# Patient Record
Sex: Female | Born: 1940 | Race: White | Hispanic: No
Health system: Southern US, Community
[De-identification: ages and names within clinical notes are randomized; demographics above are authoritative.]

## PROBLEM LIST (undated history)

## (undated) DIAGNOSIS — E119 Type 2 diabetes mellitus without complications: Secondary | ICD-10-CM

## (undated) DIAGNOSIS — F0280 Dementia in other diseases classified elsewhere without behavioral disturbance: Secondary | ICD-10-CM

## (undated) DIAGNOSIS — D649 Anemia, unspecified: Secondary | ICD-10-CM

## (undated) DIAGNOSIS — G629 Polyneuropathy, unspecified: Secondary | ICD-10-CM

---

## 2019-11-12 DIAGNOSIS — G309 Alzheimer's disease, unspecified: Secondary | ICD-10-CM | POA: Diagnosis present

## 2019-11-12 DIAGNOSIS — N390 Urinary tract infection, site not specified: Secondary | ICD-10-CM | POA: Diagnosis present

## 2019-11-12 DIAGNOSIS — Z20822 Contact with and (suspected) exposure to covid-19: Secondary | ICD-10-CM | POA: Diagnosis present

## 2019-11-12 DIAGNOSIS — D649 Anemia, unspecified: Secondary | ICD-10-CM | POA: Diagnosis present

## 2019-11-12 DIAGNOSIS — E871 Hypo-osmolality and hyponatremia: Secondary | ICD-10-CM | POA: Diagnosis present

## 2019-11-12 DIAGNOSIS — Z6821 Body mass index (BMI) 21.0-21.9, adult: Secondary | ICD-10-CM

## 2019-11-12 DIAGNOSIS — E43 Unspecified severe protein-calorie malnutrition: Secondary | ICD-10-CM | POA: Diagnosis present

## 2019-11-12 DIAGNOSIS — R6521 Severe sepsis with septic shock: Secondary | ICD-10-CM | POA: Diagnosis present

## 2019-11-12 DIAGNOSIS — Z515 Encounter for palliative care: Secondary | ICD-10-CM | POA: Diagnosis not present

## 2019-11-12 DIAGNOSIS — R001 Bradycardia, unspecified: Secondary | ICD-10-CM | POA: Diagnosis present

## 2019-11-12 DIAGNOSIS — E1110 Type 2 diabetes mellitus with ketoacidosis without coma: Secondary | ICD-10-CM | POA: Diagnosis present

## 2019-11-12 DIAGNOSIS — K3189 Other diseases of stomach and duodenum: Secondary | ICD-10-CM | POA: Diagnosis present

## 2019-11-12 DIAGNOSIS — N179 Acute kidney failure, unspecified: Secondary | ICD-10-CM | POA: Diagnosis present

## 2019-11-12 DIAGNOSIS — I4891 Unspecified atrial fibrillation: Secondary | ICD-10-CM | POA: Diagnosis present

## 2019-11-12 DIAGNOSIS — Z66 Do not resuscitate: Secondary | ICD-10-CM | POA: Diagnosis not present

## 2019-11-12 DIAGNOSIS — E039 Hypothyroidism, unspecified: Secondary | ICD-10-CM | POA: Diagnosis present

## 2019-11-12 DIAGNOSIS — E1140 Type 2 diabetes mellitus with diabetic neuropathy, unspecified: Secondary | ICD-10-CM | POA: Diagnosis present

## 2019-11-12 DIAGNOSIS — F0280 Dementia in other diseases classified elsewhere without behavioral disturbance: Secondary | ICD-10-CM | POA: Diagnosis present

## 2019-11-12 DIAGNOSIS — A419 Sepsis, unspecified organism: Principal | ICD-10-CM | POA: Diagnosis present

## 2019-11-12 DIAGNOSIS — E872 Acidosis: Secondary | ICD-10-CM | POA: Diagnosis present

## 2019-11-12 DIAGNOSIS — K8020 Calculus of gallbladder without cholecystitis without obstruction: Secondary | ICD-10-CM | POA: Diagnosis present

## 2019-11-12 DIAGNOSIS — I469 Cardiac arrest, cause unspecified: Secondary | ICD-10-CM | POA: Diagnosis present

## 2019-11-13 ENCOUNTER — Inpatient Hospital Stay: Payer: Medicare Other

## 2019-11-13 ENCOUNTER — Emergency Department: Payer: Medicare Other

## 2019-11-13 ENCOUNTER — Inpatient Hospital Stay
Admission: EM | Admit: 2019-11-13 | Discharge: 2019-11-21 | DRG: 871 | Disposition: E | Payer: Medicare Other | Source: Skilled Nursing Facility | Attending: Internal Medicine | Admitting: Internal Medicine

## 2019-11-13 ENCOUNTER — Inpatient Hospital Stay: Admit: 2019-11-13 | Payer: Medicare Other

## 2019-11-13 ENCOUNTER — Other Ambulatory Visit: Payer: Self-pay

## 2019-11-13 ENCOUNTER — Encounter: Payer: Self-pay | Admitting: Emergency Medicine

## 2019-11-13 DIAGNOSIS — R569 Unspecified convulsions: Secondary | ICD-10-CM

## 2019-11-13 DIAGNOSIS — E43 Unspecified severe protein-calorie malnutrition: Secondary | ICD-10-CM | POA: Insufficient documentation

## 2019-11-13 DIAGNOSIS — K8020 Calculus of gallbladder without cholecystitis without obstruction: Secondary | ICD-10-CM | POA: Diagnosis present

## 2019-11-13 DIAGNOSIS — R001 Bradycardia, unspecified: Secondary | ICD-10-CM | POA: Diagnosis present

## 2019-11-13 DIAGNOSIS — Z7189 Other specified counseling: Secondary | ICD-10-CM

## 2019-11-13 DIAGNOSIS — I4891 Unspecified atrial fibrillation: Secondary | ICD-10-CM | POA: Diagnosis present

## 2019-11-13 DIAGNOSIS — F0280 Dementia in other diseases classified elsewhere without behavioral disturbance: Secondary | ICD-10-CM | POA: Diagnosis present

## 2019-11-13 DIAGNOSIS — E1110 Type 2 diabetes mellitus with ketoacidosis without coma: Secondary | ICD-10-CM

## 2019-11-13 DIAGNOSIS — Z6821 Body mass index (BMI) 21.0-21.9, adult: Secondary | ICD-10-CM | POA: Diagnosis not present

## 2019-11-13 DIAGNOSIS — F0390 Unspecified dementia without behavioral disturbance: Secondary | ICD-10-CM

## 2019-11-13 DIAGNOSIS — I469 Cardiac arrest, cause unspecified: Secondary | ICD-10-CM

## 2019-11-13 DIAGNOSIS — E039 Hypothyroidism, unspecified: Secondary | ICD-10-CM | POA: Diagnosis present

## 2019-11-13 DIAGNOSIS — Z515 Encounter for palliative care: Secondary | ICD-10-CM

## 2019-11-13 DIAGNOSIS — D649 Anemia, unspecified: Secondary | ICD-10-CM | POA: Diagnosis present

## 2019-11-13 DIAGNOSIS — G309 Alzheimer's disease, unspecified: Secondary | ICD-10-CM | POA: Diagnosis present

## 2019-11-13 DIAGNOSIS — R6520 Severe sepsis without septic shock: Secondary | ICD-10-CM | POA: Diagnosis not present

## 2019-11-13 DIAGNOSIS — K3189 Other diseases of stomach and duodenum: Secondary | ICD-10-CM | POA: Diagnosis present

## 2019-11-13 DIAGNOSIS — N179 Acute kidney failure, unspecified: Secondary | ICD-10-CM | POA: Diagnosis present

## 2019-11-13 DIAGNOSIS — E1140 Type 2 diabetes mellitus with diabetic neuropathy, unspecified: Secondary | ICD-10-CM | POA: Diagnosis present

## 2019-11-13 DIAGNOSIS — A419 Sepsis, unspecified organism: Secondary | ICD-10-CM | POA: Diagnosis present

## 2019-11-13 DIAGNOSIS — R6521 Severe sepsis with septic shock: Secondary | ICD-10-CM

## 2019-11-13 DIAGNOSIS — N390 Urinary tract infection, site not specified: Secondary | ICD-10-CM | POA: Diagnosis present

## 2019-11-13 DIAGNOSIS — E872 Acidosis: Secondary | ICD-10-CM | POA: Diagnosis present

## 2019-11-13 DIAGNOSIS — E871 Hypo-osmolality and hyponatremia: Secondary | ICD-10-CM | POA: Diagnosis present

## 2019-11-13 DIAGNOSIS — Z66 Do not resuscitate: Secondary | ICD-10-CM | POA: Diagnosis not present

## 2019-11-13 DIAGNOSIS — Z20822 Contact with and (suspected) exposure to covid-19: Secondary | ICD-10-CM | POA: Diagnosis present

## 2019-11-13 DIAGNOSIS — Z452 Encounter for adjustment and management of vascular access device: Secondary | ICD-10-CM

## 2019-11-13 DIAGNOSIS — J9601 Acute respiratory failure with hypoxia: Secondary | ICD-10-CM | POA: Diagnosis not present

## 2019-11-13 HISTORY — DX: Type 2 diabetes mellitus without complications: E11.9

## 2019-11-13 HISTORY — DX: Polyneuropathy, unspecified: G62.9

## 2019-11-13 HISTORY — DX: Dementia in other diseases classified elsewhere, unspecified severity, without behavioral disturbance, psychotic disturbance, mood disturbance, and anxiety: F02.80

## 2019-11-13 HISTORY — DX: Anemia, unspecified: D64.9

## 2019-11-13 LAB — COMPREHENSIVE METABOLIC PANEL
ALT: 25 U/L (ref 0–44)
AST: 20 U/L (ref 15–41)
Albumin: 4.1 g/dL (ref 3.5–5.0)
Alkaline Phosphatase: 98 U/L (ref 38–126)
BUN: 30 mg/dL — ABNORMAL HIGH (ref 8–23)
CO2: <7 mmol/L — ABNORMAL LOW (ref 22–32)
Calcium: 9.4 mg/dL (ref 8.9–10.3)
Chloride: 85 mmol/L — ABNORMAL LOW (ref 98–111)
Creatinine, Ser: 1.91 mg/dL — ABNORMAL HIGH (ref 0.44–1.00)
GFR calc Af Amer: 28 mL/min — ABNORMAL LOW (ref >60)
GFR calc non Af Amer: 24 mL/min — ABNORMAL LOW (ref >60)
Glucose, Bld: 855 mg/dL (ref 70–99)
Potassium: 4.4 mmol/L (ref 3.5–5.1)
Sodium: 131 mmol/L — ABNORMAL LOW (ref 135–145)
Total Bilirubin: 3.3 mg/dL — ABNORMAL HIGH (ref 0.3–1.2)
Total Protein: 7.4 g/dL (ref 6.5–8.1)

## 2019-11-13 LAB — BASIC METABOLIC PANEL
Anion gap: 33 — ABNORMAL HIGH (ref 5–15)
Anion gap: 21 — ABNORMAL HIGH (ref 5–15)
BUN: 32 mg/dL — ABNORMAL HIGH (ref 8–23)
BUN: 32 mg/dL — ABNORMAL HIGH (ref 8–23)
BUN: 30 mg/dL — ABNORMAL HIGH (ref 8–23)
CO2: <7 mmol/L — ABNORMAL LOW (ref 22–32)
CO2: 17 mmol/L — ABNORMAL LOW (ref 22–32)
CO2: 29 mmol/L (ref 22–32)
Calcium: 9.3 mg/dL (ref 8.9–10.3)
Calcium: 8 mg/dL — ABNORMAL LOW (ref 8.9–10.3)
Calcium: 7.6 mg/dL — ABNORMAL LOW (ref 8.9–10.3)
Chloride: 86 mmol/L — ABNORMAL LOW (ref 98–111)
Chloride: 91 mmol/L — ABNORMAL LOW (ref 98–111)
Chloride: 94 mmol/L — ABNORMAL LOW (ref 98–111)
Creatinine, Ser: 2.18 mg/dL — ABNORMAL HIGH (ref 0.44–1.00)
Creatinine, Ser: 2.07 mg/dL — ABNORMAL HIGH (ref 0.44–1.00)
Creatinine, Ser: 1.6 mg/dL — ABNORMAL HIGH (ref 0.44–1.00)
GFR calc Af Amer: 24 mL/min — ABNORMAL LOW (ref >60)
GFR calc Af Amer: 26 mL/min — ABNORMAL LOW (ref >60)
GFR calc Af Amer: 35 mL/min — ABNORMAL LOW (ref >60)
GFR calc non Af Amer: 21 mL/min — ABNORMAL LOW (ref >60)
GFR calc non Af Amer: 22 mL/min — ABNORMAL LOW (ref >60)
GFR calc non Af Amer: 30 mL/min — ABNORMAL LOW (ref >60)
Glucose, Bld: 910 mg/dL (ref 70–99)
Glucose, Bld: 544 mg/dL (ref 70–99)
Glucose, Bld: 317 mg/dL — ABNORMAL HIGH (ref 70–99)
Potassium: 4.9 mmol/L (ref 3.5–5.1)
Potassium: 3.7 mmol/L (ref 3.5–5.1)
Potassium: 3.3 mmol/L — ABNORMAL LOW (ref 3.5–5.1)
Sodium: 133 mmol/L — ABNORMAL LOW (ref 135–145)
Sodium: 141 mmol/L (ref 135–145)
Sodium: 144 mmol/L (ref 135–145)

## 2019-11-13 LAB — CBC WITH DIFFERENTIAL/PLATELET
Abs Immature Granulocytes: 0.21 10*3/uL — ABNORMAL HIGH (ref 0.00–0.07)
Basophils Absolute: 0.1 10*3/uL (ref 0.0–0.1)
Basophils Relative: 0 %
Eosinophils Absolute: 0 10*3/uL (ref 0.0–0.5)
Eosinophils Relative: 0 %
HCT: 43.7 % (ref 36.0–46.0)
Hemoglobin: 13.5 g/dL (ref 12.0–15.0)
Immature Granulocytes: 1 %
Lymphocytes Relative: 5 %
Lymphs Abs: 0.9 10*3/uL (ref 0.7–4.0)
MCH: 31.1 pg (ref 26.0–34.0)
MCHC: 30.9 g/dL (ref 30.0–36.0)
MCV: 100.7 fL — ABNORMAL HIGH (ref 80.0–100.0)
Monocytes Absolute: 1.1 10*3/uL — ABNORMAL HIGH (ref 0.1–1.0)
Monocytes Relative: 6 %
Neutro Abs: 14.6 10*3/uL — ABNORMAL HIGH (ref 1.7–7.7)
Neutrophils Relative %: 88 %
Platelets: 425 10*3/uL — ABNORMAL HIGH (ref 150–400)
RBC: 4.34 MIL/uL (ref 3.87–5.11)
RDW: 14.1 % (ref 11.5–15.5)
WBC: 16.8 10*3/uL — ABNORMAL HIGH (ref 4.0–10.5)
nRBC: 0 % (ref 0.0–0.2)

## 2019-11-13 LAB — TROPONIN I (HIGH SENSITIVITY)
Troponin I (High Sensitivity): 11 ng/L (ref <18)
Troponin I (High Sensitivity): 250 ng/L (ref <18)
Troponin I (High Sensitivity): 335 ng/L (ref <18)

## 2019-11-13 LAB — BLOOD GAS, ARTERIAL
Acid-base deficit: 22.1 mmol/L — ABNORMAL HIGH (ref 0.0–2.0)
Bicarbonate: 6.9 mmol/L — ABNORMAL LOW (ref 20.0–28.0)
FIO2: 1
MECHVT: 400 mL
Mechanical Rate: 16
O2 Saturation: 99.8 %
PEEP: 5 cmH2O
Patient temperature: 37
pCO2 arterial: 25 mmHg — ABNORMAL LOW (ref 32.0–48.0)
pH, Arterial: 7.05 — CL (ref 7.350–7.450)
pO2, Arterial: 281 mmHg — ABNORMAL HIGH (ref 83.0–108.0)

## 2019-11-13 LAB — PROTIME-INR
INR: 1.1 (ref 0.8–1.2)
Prothrombin Time: 13.5 seconds (ref 11.4–15.2)

## 2019-11-13 LAB — GLUCOSE, CAPILLARY
Glucose-Capillary: >600 mg/dL (ref 70–99)
Glucose-Capillary: >600 mg/dL (ref 70–99)
Glucose-Capillary: 597 mg/dL (ref 70–99)
Glucose-Capillary: >600 mg/dL (ref 70–99)
Glucose-Capillary: >600 mg/dL (ref 70–99)
Glucose-Capillary: 499 mg/dL — ABNORMAL HIGH (ref 70–99)
Glucose-Capillary: 561 mg/dL (ref 70–99)
Glucose-Capillary: 439 mg/dL — ABNORMAL HIGH (ref 70–99)
Glucose-Capillary: 482 mg/dL — ABNORMAL HIGH (ref 70–99)
Glucose-Capillary: 402 mg/dL — ABNORMAL HIGH (ref 70–99)
Glucose-Capillary: 370 mg/dL — ABNORMAL HIGH (ref 70–99)
Glucose-Capillary: 363 mg/dL — ABNORMAL HIGH (ref 70–99)
Glucose-Capillary: 284 mg/dL — ABNORMAL HIGH (ref 70–99)
Glucose-Capillary: 247 mg/dL — ABNORMAL HIGH (ref 70–99)

## 2019-11-13 LAB — BLOOD GAS, VENOUS
Acid-base deficit: 28.5 mmol/L — ABNORMAL HIGH (ref 0.0–2.0)
Bicarbonate: 4.7 mmol/L — ABNORMAL LOW (ref 20.0–28.0)
O2 Saturation: 42.8 %
Patient temperature: 37
pCO2, Ven: 27 mmHg — ABNORMAL LOW (ref 44.0–60.0)
pH, Ven: <6.9 — CL (ref 7.250–7.430)
pO2, Ven: 46 mmHg — ABNORMAL HIGH (ref 32.0–45.0)

## 2019-11-13 LAB — BETA-HYDROXYBUTYRIC ACID
Beta-Hydroxybutyric Acid: >8 mmol/L — ABNORMAL HIGH (ref 0.05–0.27)
Beta-Hydroxybutyric Acid: 5.26 mmol/L — ABNORMAL HIGH (ref 0.05–0.27)

## 2019-11-13 LAB — MRSA PCR SCREENING: MRSA by PCR: NEGATIVE

## 2019-11-13 LAB — LACTIC ACID, PLASMA
Lactic Acid, Venous: 4.6 mmol/L (ref 0.5–1.9)
Lactic Acid, Venous: 3.2 mmol/L (ref 0.5–1.9)
Lactic Acid, Venous: 1.8 mmol/L (ref 0.5–1.9)
Lactic Acid, Venous: 2.1 mmol/L (ref 0.5–1.9)

## 2019-11-13 LAB — MAGNESIUM: Magnesium: 2.7 mg/dL — ABNORMAL HIGH (ref 1.7–2.4)

## 2019-11-13 LAB — LIPASE, BLOOD: Lipase: 18 U/L (ref 11–51)

## 2019-11-13 LAB — SARS CORONAVIRUS 2 BY RT PCR (HOSPITAL ORDER, PERFORMED IN ~~LOC~~ HOSPITAL LAB): SARS Coronavirus 2: NEGATIVE

## 2019-11-13 LAB — TSH: TSH: 5.028 u[IU]/mL — ABNORMAL HIGH (ref 0.350–4.500)

## 2019-11-13 LAB — PHOSPHORUS: Phosphorus: 9.4 mg/dL — ABNORMAL HIGH (ref 2.5–4.6)

## 2019-11-13 LAB — PROCALCITONIN: Procalcitonin: 1.11 ng/mL

## 2019-11-13 MED ORDER — DEXTROSE 50 % IV SOLN: 0.0000 mL | INTRAVENOUS | Status: DC | PRN

## 2019-11-13 MED ORDER — POLYETHYLENE GLYCOL 3350 17 G PO PACK
17.0000 g | PACK | Freq: Every day | ORAL | Status: DC
  Administered 2019-11-13: 17 g via ORAL
  Filled 2019-11-13: qty 1

## 2019-11-13 MED ORDER — HEPARIN SODIUM (PORCINE) 5000 UNIT/ML IJ SOLN
5000.0000 [IU] | Freq: Three times a day (TID) | INTRAMUSCULAR | Status: DC
  Administered 2019-11-13 (×2): 5000 [IU] via SUBCUTANEOUS
  Filled 2019-11-13 (×2): qty 1

## 2019-11-13 MED ORDER — FENTANYL 2500MCG IN NS 250ML (10MCG/ML) PREMIX INFUSION
INTRAVENOUS | Status: AC
  Administered 2019-11-13: 50 ug/h via INTRAVENOUS
  Filled 2019-11-13: qty 250

## 2019-11-13 MED ORDER — SODIUM BICARBONATE-DEXTROSE 150-5 MEQ/L-% IV SOLN
150.0000 meq | INTRAVENOUS | Status: DC
  Administered 2019-11-13: 150 meq via INTRAVENOUS
  Filled 2019-11-13 (×5): qty 1000

## 2019-11-13 MED ORDER — MIDAZOLAM HCL 2 MG/2ML IJ SOLN
1.0000 mg | INTRAMUSCULAR | Status: DC | PRN
  Administered 2019-11-13: 1 mg via INTRAVENOUS
  Filled 2019-11-13: qty 2

## 2019-11-13 MED ORDER — SODIUM BICARBONATE 8.4 % IV SOLN
50.0000 meq | Freq: Once | INTRAVENOUS | Status: AC
  Administered 2019-11-13: 50 meq via INTRAVENOUS

## 2019-11-13 MED ORDER — ATROPINE SULFATE 1 MG/10ML IJ SOSY
1.0000 mg | PREFILLED_SYRINGE | Freq: Once | INTRAMUSCULAR | Status: AC
  Administered 2019-11-13: 1 mg via INTRAVENOUS

## 2019-11-13 MED ORDER — SODIUM CHLORIDE 0.9 % IV SOLN: 250.0000 mL | INTRAVENOUS | Status: DC

## 2019-11-13 MED ORDER — CHLORHEXIDINE GLUCONATE CLOTH 2 % EX PADS: 6.0000 | MEDICATED_PAD | Freq: Every day | CUTANEOUS | Status: DC

## 2019-11-13 MED ORDER — EPINEPHRINE PF 1 MG/ML IJ SOLN
1.0000 mg | Freq: Once | INTRAMUSCULAR | Status: AC
  Administered 2019-11-13: 1 mg via INTRAVENOUS

## 2019-11-13 MED ORDER — POTASSIUM CHLORIDE 10 MEQ/100ML IV SOLN
10.0000 meq | INTRAVENOUS | Status: AC
  Administered 2019-11-13 (×2): 10 meq via INTRAVENOUS
  Filled 2019-11-13: qty 100

## 2019-11-13 MED ORDER — ETOMIDATE 2 MG/ML IV SOLN
10.0000 mg | Freq: Once | INTRAVENOUS | Status: AC
  Administered 2019-11-13: 10 mg via INTRAVENOUS

## 2019-11-13 MED ORDER — DOCUSATE SODIUM 50 MG/5ML PO LIQD
100.0000 mg | Freq: Two times a day (BID) | ORAL | Status: DC
  Administered 2019-11-13: 100 mg via ORAL
  Filled 2019-11-13 (×2): qty 10

## 2019-11-13 MED ORDER — SODIUM CHLORIDE 0.9 % IV SOLN
25.0000 ug/min | INTRAVENOUS | Status: DC
  Administered 2019-11-13: 25 ug/min via INTRAVENOUS
  Filled 2019-11-13: qty 1
  Filled 2019-11-13: qty 10

## 2019-11-13 MED ORDER — LEVOTHYROXINE SODIUM 50 MCG PO TABS: 25.0000 ug | ORAL_TABLET | Freq: Every day | ORAL | Status: DC

## 2019-11-13 MED ORDER — SODIUM BICARBONATE 8.4 % IV SOLN
100.0000 meq | Freq: Once | INTRAVENOUS | Status: AC
  Administered 2019-11-13: 100 meq via INTRAVENOUS

## 2019-11-13 MED ORDER — FAMOTIDINE IN NACL 20-0.9 MG/50ML-% IV SOLN
20.0000 mg | Freq: Two times a day (BID) | INTRAVENOUS | Status: DC
  Administered 2019-11-13: 20 mg via INTRAVENOUS
  Filled 2019-11-13: qty 50

## 2019-11-13 MED ORDER — LACTATED RINGERS IV SOLN: INTRAVENOUS | Status: DC

## 2019-11-13 MED ORDER — FENTANYL CITRATE (PF) 100 MCG/2ML IJ SOLN: 25.0000 ug | Freq: Once | INTRAMUSCULAR | Status: DC

## 2019-11-13 MED ORDER — HYDROCORTISONE NA SUCCINATE PF 100 MG IJ SOLR
50.0000 mg | Freq: Four times a day (QID) | INTRAMUSCULAR | Status: DC
  Administered 2019-11-13: 50 mg via INTRAVENOUS
  Filled 2019-11-13: qty 2

## 2019-11-13 MED ORDER — GLYCOPYRROLATE 0.2 MG/ML IJ SOLN: 0.2000 mg | INTRAMUSCULAR | Status: DC | PRN

## 2019-11-13 MED ORDER — POTASSIUM CHLORIDE 10 MEQ/50ML IV SOLN
10.0000 meq | Freq: Once | INTRAVENOUS | Status: AC
  Administered 2019-11-13: 10 meq via INTRAVENOUS
  Filled 2019-11-13: qty 50

## 2019-11-13 MED ORDER — LACTATED RINGERS IV BOLUS
1000.0000 mL | Freq: Once | INTRAVENOUS | Status: AC
  Administered 2019-11-13: 1000 mL via INTRAVENOUS

## 2019-11-13 MED ORDER — SODIUM CHLORIDE 0.9 % IV BOLUS
1000.0000 mL | Freq: Once | INTRAVENOUS | Status: AC
  Administered 2019-11-13: 1000 mL via INTRAVENOUS

## 2019-11-13 MED ORDER — MIDAZOLAM HCL 2 MG/2ML IJ SOLN
1.0000 mg | INTRAMUSCULAR | Status: DC | PRN
  Administered 2019-11-13 (×3): 1 mg via INTRAVENOUS
  Filled 2019-11-13 (×3): qty 2

## 2019-11-13 MED ORDER — FENTANYL BOLUS VIA INFUSION
50.0000 ug | INTRAVENOUS | Status: DC | PRN
  Administered 2019-11-13: 100 ug via INTRAVENOUS
  Filled 2019-11-13: qty 100

## 2019-11-13 MED ORDER — STERILE WATER FOR INJECTION IV SOLN
INTRAVENOUS | Status: DC
  Filled 2019-11-13 (×2): qty 850
  Filled 2019-11-13: qty 150

## 2019-11-13 MED ORDER — DOCUSATE SODIUM 100 MG PO CAPS: 100.0000 mg | ORAL_CAPSULE | Freq: Two times a day (BID) | ORAL | Status: DC | PRN

## 2019-11-13 MED ORDER — SODIUM CHLORIDE 0.9 % IV SOLN
1.0000 g | Freq: Once | INTRAVENOUS | Status: AC
  Administered 2019-11-13: 1 g via INTRAVENOUS

## 2019-11-13 MED ORDER — VASOPRESSIN 20 UNITS/100 ML INFUSION FOR SHOCK
0.0000 [IU]/min | INTRAVENOUS | Status: DC
  Administered 2019-11-13: 0.03 [IU]/min via INTRAVENOUS
  Filled 2019-11-13 (×2): qty 100

## 2019-11-13 MED ORDER — PHENYLEPHRINE CONCENTRATED 100MG/250ML (0.4 MG/ML) INFUSION SIMPLE
0.0000 ug/min | INTRAVENOUS | Status: DC
  Administered 2019-11-13: 60 ug/min via INTRAVENOUS
  Filled 2019-11-13: qty 250

## 2019-11-13 MED ORDER — POLYETHYLENE GLYCOL 3350 17 G PO PACK: 17.0000 g | PACK | Freq: Every day | ORAL | Status: DC | PRN

## 2019-11-13 MED ORDER — IPRATROPIUM-ALBUTEROL 0.5-2.5 (3) MG/3ML IN SOLN: 3.0000 mL | RESPIRATORY_TRACT | Status: DC | PRN

## 2019-11-13 MED ORDER — POTASSIUM CHLORIDE 20 MEQ PO PACK
40.0000 meq | PACK | Freq: Once | ORAL | Status: AC
  Administered 2019-11-13: 40 meq
  Filled 2019-11-13: qty 2

## 2019-11-13 MED ORDER — FENTANYL BOLUS VIA INFUSION
25.0000 ug | INTRAVENOUS | Status: DC | PRN
  Filled 2019-11-13: qty 25

## 2019-11-13 MED ORDER — FENTANYL 2500MCG IN NS 250ML (10MCG/ML) PREMIX INFUSION
0.0000 ug/h | INTRAVENOUS | Status: DC
  Administered 2019-11-13: 200 ug/h via INTRAVENOUS
  Filled 2019-11-13: qty 250

## 2019-11-13 MED ORDER — SODIUM CHLORIDE 0.9 % IV BOLUS: 1000.0000 mL | Freq: Once | INTRAVENOUS | Status: AC

## 2019-11-13 MED ORDER — SUCCINYLCHOLINE CHLORIDE 20 MG/ML IJ SOLN
120.0000 mg | Freq: Once | INTRAMUSCULAR | Status: AC
  Administered 2019-11-13: 120 mg via INTRAVENOUS

## 2019-11-13 MED ORDER — AMIODARONE IV BOLUS ONLY 150 MG/100ML
150.0000 mg | Freq: Once | INTRAVENOUS | Status: AC
  Administered 2019-11-13: 150 mg via INTRAVENOUS

## 2019-11-13 MED ORDER — SODIUM CHLORIDE 0.9 % IV BOLUS
1000.0000 mL | INTRAVENOUS | Status: AC
  Administered 2019-11-13 (×2): 1000 mL via INTRAVENOUS

## 2019-11-13 MED ORDER — INSULIN REGULAR(HUMAN) IN NACL 100-0.9 UT/100ML-% IV SOLN
INTRAVENOUS | Status: DC
  Administered 2019-11-13: 6 [IU]/h via INTRAVENOUS
  Administered 2019-11-13: 9 [IU]/h via INTRAVENOUS
  Filled 2019-11-13 (×2): qty 100

## 2019-11-13 MED ORDER — DEXTROSE IN LACTATED RINGERS 5 % IV SOLN: INTRAVENOUS | Status: DC

## 2019-11-13 MED ORDER — CHLORHEXIDINE GLUCONATE 0.12% ORAL RINSE (MEDLINE KIT)
15.0000 mL | Freq: Two times a day (BID) | OROMUCOSAL | Status: DC
  Administered 2019-11-13 (×2): 15 mL via OROMUCOSAL

## 2019-11-13 MED ORDER — SODIUM CHLORIDE 0.9 % IV SOLN
2.0000 g | INTRAVENOUS | Status: DC
  Filled 2019-11-13: qty 20

## 2019-11-13 MED ORDER — LORAZEPAM 2 MG/ML IJ SOLN
1.0000 mg | INTRAMUSCULAR | Status: DC | PRN
  Administered 2019-11-13: 2 mg via INTRAVENOUS
  Filled 2019-11-13: qty 1

## 2019-11-13 MED ORDER — ORAL CARE MOUTH RINSE
15.0000 mL | OROMUCOSAL | Status: DC
  Administered 2019-11-13 (×5): 15 mL via OROMUCOSAL

## 2019-11-13 MED ORDER — SODIUM CHLORIDE 0.9 % IV SOLN
1.0000 g | Freq: Once | INTRAVENOUS | Status: DC
  Filled 2019-11-13: qty 10

## 2019-11-14 LAB — HEMOGLOBIN A1C
Hgb A1c MFr Bld: 14.8 % — ABNORMAL HIGH (ref 4.8–5.6)
Mean Plasma Glucose: 378 mg/dL

## 2019-11-14 LAB — URINE CULTURE: Culture: NO GROWTH

## 2019-11-15 MED FILL — Vasopressin IV Soln 20 Unit/ML (For IV Infusion): INTRAVENOUS | Qty: 1 | Status: AC

## 2019-11-15 MED FILL — Sodium Chloride IV Soln 0.9%: INTRAVENOUS | Qty: 100 | Status: AC

## 2019-11-18 LAB — CULTURE, BLOOD (ROUTINE X 2)
Culture: NO GROWTH
Special Requests: ADEQUATE

## 2019-11-19 LAB — CULTURE, BLOOD (ROUTINE X 2)
Culture: NO GROWTH
Special Requests: ADEQUATE

## 2019-11-21 NOTE — Progress Notes (Signed)
Pt transported to CT and then to ICU on vent without incident. Pt remains on the vent and is tol well at this time. Report given to ICU RT.

## 2019-11-21 NOTE — ED Notes (Signed)
Pt declining with HR and O2. MD's called to bedside.

## 2019-11-21 NOTE — Progress Notes (Signed)
CDS called with Blanchie Serve.Pt can be fully released to [facility]. Ref #[id number].

## 2019-11-21 NOTE — ED Triage Notes (Addendum)
Pt brought in by EMS from [facility] ([address]); recent dx UTI and taking antibiotics since 8/16 (keflex), now with N/V and increased confusion; pt assisted into recliner, unable to stand without assistance x 2; pt with mumbling speech; unable to triage pt

## 2019-11-21 NOTE — Death Summary Note (Signed)
DEATH SUMMARY   Patient Details  Name: Shelly Walsh MRN: [id number] DOB: [birth year]  Admission/Discharge Information   Admit Date:  05/24/19  Date of Death:  003/04/21  Time of Death:  2158  Length of Stay: 0  Referring Physician: Patient, No Pcp Per   Reason(s) for Hospitalization  Altered Mental Status   Diagnoses  Preliminary cause of death: Septic shock (HCC) Secondary Diagnoses (including complications and co-morbidities):  Active Problems:   Sepsis (HCC)   Dementia without behavioral disturbance (HCC)   Advanced care planning/counseling discussion   Goals of care, counseling/discussion   Palliative care by specialist   Protein-calorie malnutrition, severe   Diabetic Ketoacidosis    PEA arrest   Brief Hospital Course (including significant findings, care, treatment, and services provided and events leading to death)  Shelly Walsh is a 79 y.o. year old female who presented to [facility] ER on 003/04/21 with DKA and sepsis likely r/t UTI. She had PEA cardiac arrest in the ED with ROSC after several minutes of resuscitation requiring mechanical intubation and ICU admission.  Palliative Care consulted on 003/04/21, following goals of treatment discussion pts family decided to change pts code status to DNR with plans to transition pt to comfort measures only when they arrived at bedside.  Pt transitioned to comfort measures only on 003/04/21 with family present and expired at 2158 at 003/04/21.  Pertinent Labs and Studies  Significant Diagnostic Studies EEG  Result Date: 05/24/19 Charlsie QuestYadav, Priyanka O, MD     05/24/19  5:30 PM Patient Name: Shelly Walsh MRN: [id number] Epilepsy Attending: Charlsie QuestPriyanka O Yadav Referring Physician/Provider:  Harlon DittyJeremiah Keene, NP Date: 05/24/19 Duration: 28.45 mins Patient history: 79 year old female status post cardiac arrest.  EEG to evaluate for seizures Level of alertness: comatose AEDs during EEG study: None Technical aspects: This EEG study was done  with scalp electrodes positioned according to the 10-20 International system of electrode placement. Electrical activity was acquired at a sampling rate of 500Hz  and reviewed with a high frequency filter of 70Hz  and a low frequency filter of 1Hz . EEG data were recorded continuously and digitally stored. Description: EEG showed near continuous generalized, maximal bifrontal 3 to 6 Hz theta-delta slowing as well as brief periods of generalized attenuation. EEG was reactive to tactile stimulation. Hyperventilation and photic stimulation were not performed.   ABNORMALITY -Continuous slow, generalized IMPRESSION: This study is suggestive of severe diffuse encephalopathy, nonspecific etiology.  No seizures or epileptiform discharges were seen throughout the recording. Priyanka Annabelle Harman Yadav   CT ABDOMEN PELVIS WO CONTRAST  Result Date: 05/24/19 CLINICAL DATA:  Abdominal distension.  Recent UTI EXAM: CT ABDOMEN AND PELVIS WITHOUT CONTRAST TECHNIQUE: Multidetector CT imaging of the abdomen and pelvis was performed following the standard protocol without IV contrast. COMPARISON:  KUB from earlier today FINDINGS: Lower chest: Mild opacity at the lung bases primarily attributed atelectasis. Borderline lower esophageal wall thickening which appears circumferential. Hepatobiliary: Cystic density in the central liver. No acute or aggressive finding.Cholelithiasis without findings of acute cholecystitis. Pancreas: Unremarkable for age Spleen: Unremarkable Adrenals/Urinary Tract: Negative adrenals. No hydronephrosis or stone. 15 mm left renal cystic density. Largely decompressed bladder by Foley catheter. Stomach/Bowel: Marked distension of the rectum and sigmoid colon by stool. No underlying bowel twist or other obstructive process is seen. Scattered colonic diverticula. Vascular/Lymphatic: No acute vascular abnormality. Extensive atherosclerotic calcification. No mass or adenopathy. Reproductive:Hysterectomy Other: No ascites or  pneumoperitoneum. Musculoskeletal: No acute abnormalities. IMPRESSION: 1. Stool distended rectum and sigmoid colon without underlying obstructive process. 2.  Gastric distension. 3. Cholelithiasis without findings of cholecystitis. Electronically Signed   By: Marnee Spring M.D.   On: 11-15-2019 05:20   CT HEAD WO CONTRAST  Result Date: November 15, 2019 CLINICAL DATA:  Mental status change with unknown cause. Alzheimer's dementia. EXAM: CT HEAD WITHOUT CONTRAST TECHNIQUE: Contiguous axial images were obtained from the base of the skull through the vertex without intravenous contrast. COMPARISON:  None. FINDINGS: Brain: No evidence of acute infarction, hemorrhage, hydrocephalus, extra-axial collection or mass lesion/mass effect. Mild for age and history cerebral volume loss. Chronic small vessel ischemia in the deep cerebral white matter. Vascular: No hyperdense vessel or unexpected calcification. Skull: Normal. Negative for fracture or focal lesion. Sinuses/Orbits: Negative for acute finding. Bilateral cataract resection. Other: Gas at the bilateral infratemporal fossa attributed to intravenous air. IMPRESSION: Aging brain without acute superimposed finding. Electronically Signed   By: Marnee Spring M.D.   On: 11-15-2019 05:17   DG Chest Port 1 View  Result Date: 11/15/2019 CLINICAL DATA:  Central line placement EXAM: PORTABLE CHEST 1 VIEW COMPARISON:  Earlier today FINDINGS: New left IJ line with tip at the upper cavoatrial junction. No pneumothorax or new mediastinal widening. Enteric endotracheal tubes remain in good position. Borderline vascular congestion. No Kerley lines or effusion. IMPRESSION: New central line without complicating feature. Electronically Signed   By: Marnee Spring M.D.   On: 2019/11/15 07:03   DG Chest Portable 1 View  Result Date: 11-15-19 CLINICAL DATA:  Intubation EXAM: PORTABLE CHEST 1 VIEW COMPARISON:  Earlier today FINDINGS: Endotracheal tube with tip halfway between the  clavicular heads and carina. The enteric tube reaches the stomach. The stomach is still gas distended. Artifact from support hardware. There is no edema, consolidation, effusion, or pneumothorax. IMPRESSION: 1. New hardware in unremarkable position. 2. The stomach remains gas distended. 3. No visible acute cardiopulmonary disease. Electronically Signed   By: Marnee Spring M.D.   On: 11-15-19 04:35   DG Chest Port 1 View  Result Date: 2019/11/15 CLINICAL DATA:  Altered mental status EXAM: PORTABLE CHEST 1 VIEW COMPARISON:  None. FINDINGS: Lung volumes are small and there is resultant vascular crowding at the hila. The lungs are clear. No pneumothorax or pleural effusion. Cardiac size within normal limits. Pulmonary vascularity is normal. No acute bone abnormality. IMPRESSION: Low lung volumes. No acute cardiopulmonary process. Electronically Signed   By: Helyn Numbers MD   On: 2019-11-15 03:48   DG Abd Portable 1 View  Result Date: 2019-11-15 CLINICAL DATA:  Intubation EXAM: PORTABLE ABDOMEN - 1 VIEW COMPARISON:  None. FINDINGS: Enteric tube which overlaps the stomach. There is still a gas distended stomach. Just right of the gastric bubble is a loop of bowel which has haustration. There is a more vague lucency further higher in the right upper quadrant which was also present on the chest x-ray, not definite for pneumoperitoneum or for distended bowel loop. Case discussed with Dr. Manson Passey, there is limited history due to patient condition and abdominal CT without contrast may be useful when clinically able. IMPRESSION: 1. Unremarkable hardware positioning. 2. Gaseous gastric distension. 3. Vague lucency over the right upper quadrant which is difficult to attribute to bowel, suggest noncontrast abdominal CT at the same time as pending head CT. Electronically Signed   By: Marnee Spring M.D.   On: 2019-11-15 04:42    Microbiology Recent Results (from the past 240 hour(s))  Culture, blood (Routine x 2)      Status: None (Preliminary result)   Collection Time:  November 17, 2019  2:37 AM   Specimen: BLOOD  Result Value Ref Range Status   Specimen Description BLOOD RIGHT FA  Final   Special Requests   Final    BOTTLES DRAWN AEROBIC AND ANAEROBIC Blood Culture adequate volume   Culture  Setup Time ANAEROBIC BOTTLE ONLY  Final   Culture   Final    NO GROWTH < 12 HOURS Performed at [facility], [address], [place], [place] [address]    Report Status PENDING  Incomplete  Culture, blood (Routine x 2)     Status: None (Preliminary result)   Collection Time: 2019/11/17  2:37 AM   Specimen: BLOOD  Result Value Ref Range Status   Specimen Description BLOOD LEFT AC  Final   Special Requests   Final    BOTTLES DRAWN AEROBIC AND ANAEROBIC Blood Culture adequate volume   Culture  Setup Time ANAEROBIC BOTTLE ONLY  Final   Culture   Final    NO GROWTH < 12 HOURS Performed at [facility], [address] [place], [place] [address]    Report Status PENDING  Incomplete  SARS Coronavirus 2 by RT PCR (hospital order, performed in [facility] hospital lab) Nasopharyngeal Nasopharyngeal Swab     Status: None   Collection Time: November 17, 2019  2:37 AM   Specimen: Nasopharyngeal Swab  Result Value Ref Range Status   SARS Coronavirus 2 NEGATIVE NEGATIVE Final    Comment: (NOTE) SARS-CoV-2 target nucleic acids are NOT DETECTED.  The SARS-CoV-2 RNA is generally detectable in upper and lower respiratory specimens during the acute phase of infection. The lowest concentration of SARS-CoV-2 viral copies this assay can detect is 250 copies / mL. A negative result does not preclude SARS-CoV-2 infection and should not be used as the sole basis for treatment or other patient management decisions.  A negative result may occur with improper specimen collection / handling, submission of specimen other than nasopharyngeal swab, presence of viral mutation(s) within the areas targeted by this assay, and  inadequate number of viral copies (<250 copies / mL). A negative result must be combined with clinical observations, patient history, and epidemiological information.  Fact Sheet for Patients:   BoilerBrush.com.cy  Fact Sheet for Healthcare Providers: https://pope.com/  This test is not yet approved or  cleared by the [place] FDA and has been authorized for detection and/or diagnosis of SARS-CoV-2 by FDA under an Emergency Use Authorization (EUA).  This EUA will remain in effect (meaning this test can be used) for the duration of the COVID-19 declaration under Section 564(b)(1) of the Act, 21 U.S.C. section 360bbb-3(b)(1), unless the authorization is terminated or revoked sooner.  Performed at [facility], [address] [place], [place] [address]   MRSA PCR Screening     Status: None   Collection Time: 2019-11-17  5:40 AM   Specimen: Nasopharyngeal  Result Value Ref Range Status   MRSA by PCR NEGATIVE NEGATIVE Final    Comment:        The GeneXpert MRSA Assay (FDA approved for NASAL specimens only), is one component of a comprehensive MRSA colonization surveillance program. It is not intended to diagnose MRSA infection nor to guide or monitor treatment for MRSA infections. Performed at [facility], [address] [place], [place] [address]     Lab Basic Metabolic Panel: Recent Labs  Lab 2019-11-17 0024 2019/11/17 0239 2019/11/17 0802 Nov 17, 2019 1148  NA 131* 133* 141 144  K 4.4 4.9 3.7 3.3*  CL 85*  86* 91* 94*  CO2 <7* <7* 17* 29  GLUCOSE 855* 910* 544* 317*  BUN 30* 32* 32* 30*  CREATININE 1.91* 2.18* 2.07* 1.60*  CALCIUM 9.4 9.3 8.0* 7.6*  MG  --  2.7*  --   --   PHOS  --  9.4*  --   --    Liver Function Tests: Recent Labs  Lab November 19, 2019 0024  AST 20  ALT 25  ALKPHOS 98  BILITOT 3.3*  PROT 7.4  ALBUMIN 4.1   Recent Labs  Lab 11-19-2019 0024  LIPASE 18   No results for  input(s): AMMONIA in the last 168 hours. CBC: Recent Labs  Lab 11-19-2019 0024  WBC 16.8*  NEUTROABS 14.6*  HGB 13.5  HCT 43.7  MCV 100.7*  PLT 425*   Cardiac Enzymes: No results for input(s): CKTOTAL, CKMB, CKMBINDEX, TROPONINI in the last 168 hours. Sepsis Labs: Recent Labs  Lab 11/19/19 0024 November 19, 2019 0237 Nov 19, 2019 0239 11-19-2019 0648 11/19/19 0802  PROCALCITON  --   --  1.11  --   --   WBC 16.8*  --   --   --   --   LATICACIDVEN 4.6* 3.2*  --  1.8 2.1*    Procedures/Operations  Mechanical Intubation  Sonda Rumble, AGNP  Pulmonary/Critical Care Pager [phone] (please enter 7 digits) PCCM Consult Pager [phone] (please enter 7 digits)
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
## 2019-11-21 NOTE — H&P (Signed)
Name: Shelly Walsh MRN: [id number] DOB: 04-26-1940    ADMISSION DATE:  11/16/19 CONSULTATION DATE:  November 16, 2019  REFERRING MD :  Dr. Owens Shark  CHIEF COMPLAINT:  Altered Mental Status  BRIEF PATIENT DESCRIPTION:  79 y.o. Female with PMH of Alzheimer's, DM, and Anemia admitted with DKA and Sepsis secondary to UTI.  While in ED pt suffered brief PEA cardiac arrest (approximately 3 minutes), cardiac arrest likely due to severe metabolic derangements and severe metabolic acidosis.  TTM was not initiated as pt was able to follow commands post arrest.  SIGNIFICANT EVENTS  8/24: Presents to ED; PCCM asked to admit 8/24: Brief PEA cardiac arrest while in ED 8/24: After arrival to ICU, pt able to follow simple commands for nursing 8/24: Left IJ CVC placed  STUDIES:  8/24: CXR>>Lung volumes are small and there is resultant vascular crowding at the hila. The lungs are clear. No pneumothorax or pleural effusion. Cardiac size within normal limits. Pulmonary vascularity is normal. No acute bone abnormality. 8/24: CT Head w/o contrast>>Aging brain without acute superimposed finding. 8/24: CT Abdomen & Pelvis w/o contrast>>1. Stool distended rectum and sigmoid colon without underlying obstructive process. 2. Gastric distension. 3. Cholelithiasis without findings of cholecystitis. 8/24: 2D Echocardiogram>>  CULTURES: SARS-CoV-2 PCR 8/24>> NEGATIVE Blood culture x2 8/24>> Urine 8/24>>  ANTIBIOTICS: Ceftriaxone 8/24>>  HISTORY OF PRESENT ILLNESS:   Shelly Walsh is a 79 y.o. Female with a past medical history significant for Alzheimer's Dementia, Anemia, and Diabetes Mellitus who presents to [facility] ED on 16-Nov-2019 from [facility] due to Altered mental status (increasing confusion).  She was noted to have a recent diagnosis of UTI on 11/05/19 of which she has been taking Keflex.  She is also reported to have had nausea and vomiting.  Upon presentation to the ED, she is afebrile,  with RR 22, HR 102, BP 106/49, and 100 SpO2 on room air.  Initial workup reveals lactic acid 4.6, WBC 16.8 w/ Neutrophilia, Glucose 855, Bicarb <7, Anion gap uncalculated, sodium 131, BUN 30, and Creatinine 1.9, high sensitivity troponin 11.  She met criteria for Sepsis and for DKA, of which she is to receive 2L IV fluid boluses, insulin drip, and being placed on Rocephin.  CXR is negative for acute cardiopulmonary process, Urinalysis and VBG are pending.  Her COVID-19 PCR is negative.  PCCM is asked to admit the pt for further workup and treatment of Severe Sepsis in the setting of UTI, DKA, and Acute Kidney Injury.  At approximately 3:45 AM on 11-16-19 in the ED while awaiting bed placement in ICU,  the patient's heart rate has decreased, found to be bradycardic heart rate of 38.  Patient was given atropine.  Anticipating impending cardiac arrest and as such we prepared for endotracheal intubation. While preparing for intubation, pt did lose her pulse, of which  epinephrine and atropine were administered with continuous CPR.  At first pulse check spontaneous circulation was regained.  Endotracheal intubation performed without difficulty by ED Provider Dr. Owens Shark. Follow up EKG without evidence of STEMI.  Suspect that cardiac arrest is attributed to severe metabolic derangements and severe metabolic acidosis.  Cardiology and Palliative Care are consulted.  After arrival to ICU pt was able to follow simple commands, therefore Targeted Temperature Management was not initiated.   PAST MEDICAL HISTORY :   has a past medical history of Alzheimer's dementia ([place]), Anemia, Diabetes mellitus without complication ([place]), and Neuropathy.  has no past surgical history on file. Prior  to Admission medications   Not on File   No Known Allergies  FAMILY HISTORY:  family history is not on file. SOCIAL HISTORY:     COVID-19 DISASTER DECLARATION:  FULL CONTACT PHYSICAL EXAMINATION WAS NOT POSSIBLE DUE TO  TREATMENT OF COVID-19 AND  CONSERVATION OF PERSONAL PROTECTIVE EQUIPMENT, LIMITED EXAM FINDINGS INCLUDE-  Patient assessed or the symptoms described in the history of present illness.  In the context of the Global COVID-19 pandemic, which necessitated consideration that the patient might be at risk for infection with the SARS-CoV-2 virus that causes COVID-19, Institutional protocols and algorithms that pertain to the evaluation of patients at risk for COVID-19 are in a state of rapid change based on information released by regulatory bodies including the CDC and federal and state organizations. These policies and algorithms were followed during the patient's care while in hospital.  REVIEW OF SYSTEMS:   Unable to assess due to AMS and critical illness  SUBJECTIVE:  Unable to assess due to AMS and critical illness  VITAL SIGNS: Temp:  [98.2 F (36.8 C)] 98.2 F (36.8 C) (08/24 0005) Pulse Rate:  [102] 102 (08/24 0005) Resp:  [22] 22 (08/24 0005) BP: (106)/(49) 106/49 (08/24 0005) SpO2:  [100 %] 100 % (08/24 0005) Weight:  [48.1 kg] 48.1 kg (08/24 0044)  PHYSICAL EXAMINATION: General:  Acute on chronically ill appearing female, laying in bed, minimally responsive, in no acute distress Neuro:  Obtunded, withdraws from pain, moans, pupils PERRLA HEENT:  Atraumatic, normocephalic, neck supple, no JVD Cardiovascular:  Tachycardia, irregularly irregular rhythm, no M/R/G Lungs:  Coarse breath sounds bilaterally, Kussmaul's respirations Abdomen:  Soft, nontender, nondistended, no guarding or rebound tenderness, BS+ x4 Musculoskeletal:  No deformities, no edema Skin:  Warm and dry.  No obvious rashes, lesions, or ulcerations  Recent Labs  Lab 12/09/19 0024  NA 131*  K 4.4  CL 85*  CO2 <7*  BUN 30*  CREATININE 1.91*  GLUCOSE 855*   Recent Labs  Lab Dec 09, 2019 0024  HGB 13.5  HCT 43.7  WBC 16.8*  PLT 425*   No results found.  ASSESSMENT / PLAN:  Cardiac arrest (PEA), likely  secondary to severe metabolic Derangements + Severe Metabolic Acidosis Atrial Fibrillation w/ RVR -Continuous cardiac monitoring -Maintain MAP >65 -IV Fluids -Vasopressors if needed to maintain MAP goal -Trend troponin -EKG without evidence of STEMI -Aspirin -Obtain 2D Echocardiogram -Cardiology consulted, appreciate input -Trend lactic acid -Bicarb gtt   Severe Sepsis secondary to UTI -Monitor fever curve -Trend WBC's & Procalcitonin -Follow cultures as above -Continue Rocephin -Trend lactic acid   DKA -Follow DKA protocol -Trend BMP & Beta-Hydroxybutric acid -IV fluids -Insulin gtt -When Anion Gap closed and serum Bicarb >20, can convert to SSI and Long acting insulin -Bicarb gtt due to severe acidosis -Consult Diabetes coordinator -Check Hgb A1c   AKI Anion Gap Metabolic Acidosis in setting of DKA & Lactic acidosis Hypertonic Hyponatremia (corrected Na given glucose 855 is Na 143) -Monitor I&O's / urinary output -Follow BMP -Ensure adequate renal perfusion -Avoid nephrotoxic agents as able -Replace electrolytes as indicated -IV Fluids -Trend lactic acid -Bicarb gtt -Consider Nephrology consult   Hypothyroidism -Start on Synthroid (discussed with Pharmacy for appropriate dosing) -Trend TSH       Pt's is critically ill,  prognosis is guarded.  She is at high risk for cardiac arrest and death.  Recommend DNR/DNI status.    BEST PRACTICES DISPOSITION: ICU GOALS OF CARE: Full Code  VTE PROPHYLAXIS: SQ Heparin SUP: IV  Pepcid CONSULTS: Cardiology, Neurology, Palliative Care UPDATES: Had extensive discussion with pt's granddaughter Jari Sportsman [facility] [place]) and her mother Beverely Risen (pts' daughter in [place]) regarding her critical illness, poor prognosis, and high chance for further cardiac arrest.  They are considering DNR status, however currently want pt to remain Full Code at this time as they are trying to arrive from out of town (about 4 hours away).  They  do request a call if pt looks as if she is going to cardiac arrest again, and they would likely make her DNR at that time.   Darel Hong, AGACNP-BC [facility] Pager: [phone]  11/23/2019, 2:47 AM

## 2019-11-21 NOTE — ED Notes (Signed)
Orders clicked off by accident.

## 2019-11-21 NOTE — Progress Notes (Signed)
Per Palliative Care Nurse family has decided to make patient a DNR and transition to comfort care today around 18:00-19:00. Per Dr. Belia Heman maintain pressors, sedation and bicarb and discontinue all other orders. Orders to stop endo tool, medications and labs. Continue to assess.

## 2019-11-21 NOTE — ED Provider Notes (Addendum)
[facility] Emergency Department Provider Note  ____________________________________________   First MD Initiated Contact with Patient 10-Dec-2019 0221     (approximate)  I have reviewed the triage vital signs and the nursing notes.  Level 5 caveat history review of system limited secondary to Alzheimer's dementia HISTORY  Chief Complaint Altered Mental Status   HPI Shelly Walsh is a 79 y.o. female with below list of previous medical conditions including Alzheimer's dementia diabetes mellitus and anemia presents to the emergency department [facility] with history of recent diagnosis of urinary tract infection on 11/05/2019 for which patient is currently taking Keflex.  Patient now having nausea and vomiting and increased confusion over  the course of today.        Past Medical History:  Diagnosis Date  . Alzheimer's dementia (HCC)   . Anemia   . Diabetes mellitus without complication (HCC)   . Neuropathy     There are no problems to display for this patient.    Prior to Admission medications   Not on File    Allergies Patient has no known allergies.  No family history on file.  Social History Social History   Tobacco Use  . Smoking status: Not on file  Substance Use Topics  . Alcohol use: Not on file  . Drug use: Not on file    Review of Systems Constitutional: No fever/chills Eyes: No visual changes. ENT: No sore throat. Cardiovascular: Denies chest pain. Respiratory: Denies shortness of breath. Gastrointestinal: Positive for nausea and vomiting Genitourinary: Negative for dysuria. Musculoskeletal: Negative for neck pain.  Negative for back pain. Integumentary: Negative for rash. Neurological: Positive for increasing confusion  ____________________________________________   PHYSICAL EXAM:  VITAL SIGNS: ED Triage Vitals  Enc Vitals Group     BP 12-10-19 0005 (!) 106/49     Pulse Rate  10-Dec-2019 0005 (!) 102     Resp 12-10-19 0005 (!) 22     Temp 10-Dec-2019 0005 98.2 F (36.8 C)     Temp Source Dec 10, 2019 0005 Oral     SpO2 12-10-19 0005 100 %     Weight 12-10-2019 0044 48.1 kg (106 lb)     Height --      Head Circumference --      Peak Flow --      Pain Score --      Pain Loc --      Pain Edu? --      Excl. in GC? --     Constitutional: Alert, ill-appearing Eyes: Conjunctivae are normal.  Head: Atraumatic. Mouth/Throat: Patient is wearing a mask. Neck: No stridor.  No meningeal signs.   Cardiovascular: Tachycardia, regular rhythm. Good peripheral circulation. Grossly normal heart sounds. Respiratory: Normal respiratory effort.  No retractions.  Kussmaul's respirations with ketones noted on breath Gastrointestinal: Soft and nontender. No distention.  Musculoskeletal: No lower extremity tenderness nor edema. No gross deformities of extremities. Neurologic: No gross focal neurologic deficits are appreciated.  Skin:  Skin is warm, dry and intact. Psychiatric: Mood and affect are normal. Speech and behavior are normal.  ____________________________________________   LABS (all labs ordered are listed, but only abnormal results are displayed)  Labs Reviewed  CBC WITH DIFFERENTIAL/PLATELET - Abnormal; Notable for the following components:      Result Value   WBC 16.8 (*)    MCV 100.7 (*)    Platelets 425 (*)    Neutro Abs 14.6 (*)    Monocytes  Absolute 1.1 (*)    Abs Immature Granulocytes 0.21 (*)    All other components within normal limits  COMPREHENSIVE METABOLIC PANEL - Abnormal; Notable for the following components:   Sodium 131 (*)    Chloride 85 (*)    CO2 <7 (*)    Glucose, Bld 855 (*)    BUN 30 (*)    Creatinine, Ser 1.91 (*)    Total Bilirubin 3.3 (*)    GFR calc non Af Amer 24 (*)    GFR calc Af Amer 28 (*)    All other components within normal limits  LACTIC ACID, PLASMA - Abnormal; Notable for the following components:   Lactic Acid, Venous  4.6 (*)    All other components within normal limits  GLUCOSE, CAPILLARY - Abnormal; Notable for the following components:   Glucose-Capillary >600 (*)    All other components within normal limits  CULTURE, BLOOD (ROUTINE X 2)  CULTURE, BLOOD (ROUTINE X 2)  SARS CORONAVIRUS 2 BY RT PCR (HOSPITAL ORDER, PERFORMED IN [place] [facility])  URINE CULTURE  LIPASE, BLOOD  URINALYSIS, COMPLETE (UACMP) WITH MICROSCOPIC  LACTIC ACID, PLASMA  BETA-HYDROXYBUTYRIC ACID  BETA-HYDROXYBUTYRIC ACID  BETA-HYDROXYBUTYRIC ACID  URINALYSIS, ROUTINE W REFLEX MICROSCOPIC  BLOOD GAS, VENOUS  PROTIME-INR  PROCALCITONIN  CBG MONITORING, ED  TROPONIN I (HIGH SENSITIVITY)   ____________________________________________  EKG  ED ECG REPORT I, Peavine N Ferrel Simington, the attending physician, personally viewed and interpreted this ECG.   Date: 11/20/19  EKG Time: 12:16 AM  Rate: 112  Rhythm: Sinus tachycardia  Axis: Normal  Intervals: Normal  ST&T Change: None   ____________________________________________   PROCEDURES    .Critical Care Performed by: Darci Current, MD Authorized by: Darci Current, MD   Critical care provider statement:    Critical care time (minutes):  90   Critical care time was exclusive of:  Separately billable procedures and treating other patients (DKA)   Critical care was necessary to treat or prevent imminent or life-threatening deterioration of the following conditions:  Sepsis   Critical care was time spent personally by me on the following activities:  Development of treatment plan with patient or surrogate, discussions with consultants, evaluation of patient's response to treatment, examination of patient, obtaining history from patient or surrogate, ordering and performing treatments and interventions, ordering and review of laboratory studies, ordering and review of radiographic studies, pulse oximetry, re-evaluation of patient's condition and review of  old charts  Procedure Name: Intubation Date/Time: 20-Nov-2019 4:15 AM Performed by: Darci Current, MD Pre-anesthesia Checklist: Patient identified, Patient being monitored, Emergency Drugs available, Timeout performed and Suction available Oxygen Delivery Method: Non-rebreather mask Preoxygenation: Pre-oxygenation with 100% oxygen Induction Type: Rapid sequence Ventilation: Mask ventilation without difficulty Laryngoscope Size: 4 Tube size: 7.5 mm Number of attempts: 1 Placement Confirmation: ETT inserted through vocal cords under direct vision,  CO2 detector and Breath sounds checked- equal and bilateral Secured at: 23 cm Tube secured with: ETT holder Difficulty Due To: Difficulty was unanticipated        ____________________________________________   INITIAL IMPRESSION / MDM / ASSESSMENT AND PLAN / ED COURSE  As part of my medical decision making, I reviewed the following data within the electronic MEDICAL RECORD NUMBER  79 year old female presented with above-stated history and physical exam a differential diagnosis including but not limited to sepsis, diabetic ketoacidosis.  Both DKA and sepsis protocol was initiated.  Laboratory data revealed glucose of a 855, CO2 less than 7 BUN  of 30 creatinine of 1.91, lactic acid of 4.6 white count 16.8.  Patient given IV ceftriaxone 1 g.  Insulin infusion, normal saline bolus administered.  Patient discussed with ICU nurse practitioner for admission for further evaluation and management.  _   Approximately 3:45 AM I was notified by the nursing staff that the patient heart rate has decreased.  On my arrival to the room the patient was bradycardic heart rate of 38.  Patient was given atropine.  Anticipating impending cardiac arrest and as such we prepared for endotracheal intubation.  Patient did have cardiac arrest epinephrine and atropine administered with continuous CPR.  At first pulse check spontaneous circulation was regained.   Endotracheal intubation performed without difficulty.  __________________________  FINAL CLINICAL IMPRESSION(S) / ED DIAGNOSES  Final diagnoses:  Diabetic ketoacidosis without coma associated with type 2 diabetes mellitus (HCC)  Sepsis, due to unspecified organism, unspecified whether acute organ dysfunction present (HCC)     MEDICATIONS GIVEN DURING THIS VISIT:  Medications  insulin regular, human (MYXREDLIN) 100 units/ 100 mL infusion (has no administration in time range)  lactated ringers infusion (has no administration in time range)  dextrose 5 % in lactated ringers infusion (0 mLs Intravenous Hold 11-18-19 0214)  dextrose 50 % solution 0-50 mL (has no administration in time range)  sodium chloride 0.9 % bolus 1,000 mL (has no administration in time range)  potassium chloride 10 mEq in 100 mL IVPB (has no administration in time range)  cefTRIAXone (ROCEPHIN) 1 g in sodium chloride 0.9 % 100 mL IVPB (has no administration in time range)  sodium chloride 0.9 % bolus 1,000 mL (has no administration in time range)     ED Discharge Orders    None      *Please note:  Shelly Walsh was evaluated in Emergency Department on 11/18/2019 for the symptoms described in the history of present illness. She was evaluated in the context of the global COVID-19 pandemic, which necessitated consideration that the patient might be at risk for infection with the SARS-CoV-2 virus that causes COVID-19. Institutional protocols and algorithms that pertain to the evaluation of patients at risk for COVID-19 are in a state of rapid change based on information released by regulatory bodies including the CDC and federal and state organizations. These policies and algorithms were followed during the patient's care in the ED.  Some ED evaluations and interventions may be delayed as a result of limited staffing during and after the pandemic.*  Note:  This document was prepared using Dragon voice recognition software  and may include unintentional dictation errors.   Darci Current, MD November 18, 2019 3536    Darci Current, MD Nov 18, 2019 [phone]

## 2019-11-21 NOTE — Progress Notes (Signed)
Pharmacy Electrolyte Monitoring Consult:  Pharmacy consulted to assist in monitoring and replacing electrolytes in this 79 y.o. female admitted on December 04, 2019 with Altered Mental Status   Labs:  Sodium (mmol/L)  Date Value  12-04-2019 131 (L)   Potassium (mmol/L)  Date Value  12/04/19 4.4   Calcium (mg/dL)  Date Value  09/19/1599 9.4   Albumin (g/dL)  Date Value  09/32/3557 4.1    Assessment/Plan: Goal Na 135 - 145 K > 4.0 Mag > 2.0 Phos 2.5 - 4.5  Na 131 patient currently on LR at 125 ml/hr and insulin drip at 6 units/hr for closure of anion gap and resolution of DKA, Na expected to correct w/ resolution of DKA, K WNL, Mg/Phos pending w/ am labs will continue to monitor.  Thomasene Ripple, PharmD, BCPS Clinical Pharmacist 2019-12-04 3:28 AM

## 2019-11-21 NOTE — ED Notes (Signed)
RENEE LEE (DAUGHTER IN LAW)  [phone]

## 2019-11-21 NOTE — ED Notes (Signed)
Pulse check, pulses present

## 2019-11-21 NOTE — Progress Notes (Signed)
Chaplain provided spiritual support prior to, during, and after extubation through death to family. Family grieving well for complex grief related to many loses over the past few years. Using [employer].     11/20/2019 2200  Clinical Encounter Type  Visited With Patient and family together  Visit Type Critical Care;Death  Spiritual Encounters  Spiritual Needs Grief support

## 2019-11-21 NOTE — ED Notes (Signed)
Pulses lost, CPR initiated

## 2019-11-21 NOTE — Progress Notes (Signed)
Dr End notified of elevated troponin. No additional orders. Per Dr. Belia Heman orders to use central line. MD reviewed chest xray.

## 2019-11-21 NOTE — ED Notes (Signed)
Pt transported to CT and then to ICU. Report called and handoff report provided.

## 2019-11-21 NOTE — Progress Notes (Signed)
Pts granddaughter Verlin Grills, [place] along with additional family members and would like to transition pt to comfort measures only.  Therefore, will place orders for comfort care only.  Sonda Rumble, AGNP  Pulmonary/Critical Care Pager [phone] (please enter 7 digits) PCCM Consult Pager [phone] (please enter 7 digits)

## 2019-11-21 NOTE — Progress Notes (Signed)
Initial Nutrition Assessment  DOCUMENTATION CODES:   Severe malnutrition in context of social or environmental circumstances  INTERVENTION:  Once plan is to initiate tube feeds recommend: -Initiate Vital AF 1.2 Cal at 15 mL/hr and advance by 15 mL/hr every 12 hours to goal rate of 45 mL/hr (1080 mL goal daily volume) -Provides 1296 kcal, 81 grams of protein, 875 mL H2O daily.  Monitor magnesium, potassium, and phosphorus daily for at least 3 days, MD to replete as needed, as pt is at risk for refeeding syndrome.  NUTRITION DIAGNOSIS:   Severe Malnutrition related to social / environmental circumstances (Alzheimer's dementia, advanced age) as evidenced by severe fat depletion, moderate muscle depletion, severe muscle depletion.  GOAL:   Patient will meet greater than or equal to 90% of their needs  MONITOR:   Vent status, Labs, Weight trends, TF tolerance, I & O's  REASON FOR ASSESSMENT:   Ventilator    ASSESSMENT:   79 year old female with PMHx of DM, Alzheimer's dementia, neuropathy, anemia admitted from [facility] with DKA and sepsis secondary to UTI, while in ED suffered brief PEA cardiac arrest and was intubated.   8/24 intubated  Patient is currently intubated on ventilator support MV: 8 L/min Temp (24hrs), Avg:95.5 F (35.3 C), Min:92.8 F (33.8 C), Max:98.4 F (36.9 C)  Medications reviewed and include: Colace 100 mg BID, Solu-cortef 50 mg Q6hrs IV, levothyroxine, Miralax, ceftriaxone, famotidine, fentanyl gtt, regular insulin gtt, phenylephrine gtt at 100 mcg/min, sodium bicarbonate 150 mEq in sterile water at 125 mL/hr, vasopressin gtt.  Labs reviewed: CBG 363-499, Chloride 91, CO2 17, BUN 32.  I/O: 800 mL output from OGT so far this AM since shift change  Enteral Access: 16 Fr. OGT placed 8/24; terminates in stomach per abdominal x-ray today  Discussed with RN and on rounds. Plan is to hold off on starting tube feeds today. RD  measured height during assessment (59").  NUTRITION - FOCUSED PHYSICAL EXAM:    Most Recent Value  Orbital Region Severe depletion  Upper Arm Region Severe depletion  Thoracic and Lumbar Region Moderate depletion  Buccal Region Severe depletion  Temple Region Moderate depletion  Clavicle Bone Region Severe depletion  Clavicle and Acromion Bone Region Moderate depletion  Scapular Bone Region Unable to assess  Dorsal Hand Unable to assess  Patellar Region Severe depletion  Anterior Thigh Region Severe depletion  Posterior Calf Region Severe depletion  Edema (RD Assessment) None  Hair Reviewed  Eyes Unable to assess  Mouth Unable to assess  Skin Reviewed  Nails Reviewed     Diet Order:   Diet Order            Diet NPO time specified  Diet effective now                EDUCATION NEEDS:   No education needs have been identified at this time  Skin:  Skin Assessment: Reviewed RN Assessment  Last BM:  Unknown  Height:   Ht Readings from Last 1 Encounters:  2019-12-07 4\' 11"  (1.499 m)   Weight:   Wt Readings from Last 1 Encounters:  12-07-2019 47.9 kg   BMI:  Body mass index is 21.33 kg/m.  Estimated Nutritional Needs:   Kcal:  1200-1400  Protein:  70-80 grams  Fluid:  1.2-1.4 L/day  11/15/19, MS, RD, LDN Pager number available on Amion

## 2019-11-21 NOTE — Progress Notes (Signed)
Inpatient Diabetes Program Recommendations  AACE/ADA: New Consensus Statement on Inpatient Glycemic Control   Target Ranges:  Prepandial:   less than 140 mg/dL      Peak postprandial:   less than 180 mg/dL (1-2 hours)      Critically ill patients:  140 - 180 mg/dL   Results for JOPLIN, CANTY (MRN [id number]) as of 11-24-19 10:24  Ref. Range November 24, 2019 02:32 11/24/2019 05:28 November 24, 2019 05:57 11-24-2019 06:29 11/24/19 06:59 2019/11/24 07:41 November 24, 2019 08:28 11/24/2019 08:55 11-24-2019 09:24 November 24, 2019 09:53  Glucose-Capillary Latest Ref Range: 70 - 99 mg/dL >409 (HH) >811 (HH) >914 (HH) 597 (HH) >600 (HH) 561 (HH) 499 (H) 482 (H) 439 (H) 402 (H)   Results for MAYAR, WHITTIER (MRN [id number]) as of November 24, 2019 10:24  Ref. Range 24-Nov-2019 00:24 2019/11/24 02:37 11/24/19 02:39  Beta-Hydroxybutyric Acid Latest Ref Range: 0.05 - 0.27 mmol/L  >8.00 (H)   Glucose Latest Ref Range: 70 - 99 mg/dL 086 (HH)  578 (HH)   Review of Glycemic Control  Diabetes history: DM2 Outpatient Diabetes medications: Lantus 14 units daily, Metformin 500 mg BID Current orders for Inpatient glycemic control: IV insulin per DKA Endotool  Inpatient Diabetes Program Recommendations:    Insulin: IV insulin should be continued until acidosis resolved. Once acidosis is resolved, consider using ICU Phase 1 Glycemic order set to order Lantus 5 units Q12H, CBGs Q4H, Novolog 1-3 units Q4H.  NOTE: Noted consult for diabetes coordinator. Chart reviewed. Noted patient admitted from [facility] facility with sepsis secondary to UTI, DKA, and had PEA arrest while in the Emergency Room last night. Noted patient has hx of Alzheimers.  Initial glucose 855 mg/dl on 4/69/62 and patient was started on IV insulin.  Patient is currently on ventilator and IV insulin per DKA order set. IV insulin should be continued until acidosis resolved. Once acidosis is resolved, would recommend using ICU Glycemic Control  Phase 1 order set. Will continue to follow  along while inpatient.  Thanks, Orlando Penner, RN, MSN, CDE Diabetes Coordinator Inpatient Diabetes Program [phone] (Team Pager from 8am to 5pm)

## 2019-11-21 NOTE — Procedures (Signed)
Patient Name: Shelly Walsh  MRN: [id number]  Epilepsy Attending: Charlsie Quest  Referring Physician/Provider:  [name], NP Date: 2019/11/22 Duration: 28.45 mins  Patient history: 79 year old female status post cardiac arrest.  EEG to evaluate for seizures  Level of alertness: comatose  AEDs during EEG study: None  Technical aspects: This EEG study was done with scalp electrodes positioned according to the 10-20 International system of electrode placement. Electrical activity was acquired at a sampling rate of 500Hz  and reviewed with a high frequency filter of 70Hz  and a low frequency filter of 1Hz . EEG data were recorded continuously and digitally stored.   Description: EEG showed near continuous generalized, maximal bifrontal 3 to 6 Hz theta-delta slowing as well as brief periods of generalized attenuation. EEG was reactive to tactile stimulation. Hyperventilation and photic stimulation were not performed.     ABNORMALITY -Continuous slow, generalized  IMPRESSION: This study is suggestive of severe diffuse encephalopathy, nonspecific etiology.  No seizures or epileptiform discharges were seen throughout the recording.  Oree Hislop 

## 2019-11-21 NOTE — Progress Notes (Signed)
Unable to complete admission profile. Patient is on vent, no family throughout the day. Per Palliative care patients family to arrive around 18:00-19:00.

## 2019-11-21 NOTE — Progress Notes (Signed)
[facility] called ref # [id number]. Per Felipa Eth call with time of death.

## 2019-11-21 NOTE — ED Notes (Signed)
Spoke with caregiver at [facility] and further infor gathered to complete triage [phone])

## 2019-11-21 NOTE — Progress Notes (Signed)
eeg done °

## 2019-11-21 NOTE — Progress Notes (Signed)
Pharmacy Electrolyte Monitoring Consult:  Pharmacy consulted to assist in monitoring and replacing electrolytes in this 79 y.o. female admitted on 11/17/19 with Altered Mental Status   Labs:  Sodium (mmol/L)  Date Value  2019-11-17 144   Potassium (mmol/L)  Date Value  11-17-2019 3.3 (L)   Magnesium (mg/dL)  Date Value  15/07/6977 2.7 (H)   Phosphorus (mg/dL)  Date Value  48/03/6551 9.4 (H)   Calcium (mg/dL)  Date Value  74/82/7078 7.6 (L)   Albumin (g/dL)  Date Value  67/54/4920 4.1    Assessment/Plan: Goal Na 135 - 145 K > 4.0 Mag > 2.0 Phos 2.5 - 4.5  Potassium trend down on insulin and bicarb drips. Replace with potassium 40 mEq per tube and 10 mEq IV x 1. Anion gap still elevated, bicarb improved.  Laureen Ochs, PharmD Clinical Pharmacist 11-17-19 1:45 PM

## 2019-11-21 NOTE — Progress Notes (Signed)
CODE SEPSIS - PHARMACY COMMUNICATION  **Broad Spectrum Antibiotics should be administered within 1 hour of Sepsis diagnosis**  Time Code Sepsis Called/Page Received: 0209  Antibiotics Ordered: ceftriaxone  Time of 1st antibiotic administration: 0253  Additional action taken by pharmacy:   If necessary, Name of Provider/Nurse Contacted:     Thomasene Ripple ,PharmD Clinical Pharmacist  Nov 22, 2019  3:26 AM

## 2019-11-21 NOTE — Procedures (Signed)
Central Venous Catheter Insertion Procedure Note  Shelly Walsh  [id number]  11/04/1940  Date:11-14-2019  Time:6:22 AM   Provider Performing:Jadarion Halbig D Elvina Sidle   Procedure: Insertion of Non-tunneled Central Venous Catheter(36556) with US guidance (48185)   Indication(s) Medication administration and Difficult access  Consent Unable to obtain consent due to emergent nature of procedure.  Anesthesia Topical only with 1% lidocaine   Timeout Verified patient identification, verified procedure, site/side was marked, verified correct patient position, special equipment/implants available, medications/allergies/relevant history reviewed, required imaging and test results available.  Sterile Technique Maximal sterile technique including full sterile barrier drape, hand hygiene, sterile gown, sterile gloves, mask, hair covering, sterile ultrasound probe cover (if used).  Procedure Description Area of catheter insertion was cleaned with chlorhexidine and draped in sterile fashion.  With real-time ultrasound guidance a central venous catheter was placed into the left internal jugular vein. Nonpulsatile blood flow and easy flushing noted in all ports.  The catheter was sutured in place and sterile dressing applied.  Complications/Tolerance None; patient tolerated the procedure well. Chest X-ray is ordered to verify placement for internal jugular or subclavian cannulation.   Chest x-ray is not ordered for femoral cannulation.  EBL Minimal  Specimen(s) None  Line was secured at the 20 cm mark.  BIOPATCH placed to the insertion site.   [name], AGACNP-BC  Pulmonary & Critical Care Medicine Pager: [phone]

## 2019-11-21 DEATH — deceased

## 2022-04-16 IMAGING — CT CT HEAD W/O CM
3 series · 16 of 47 positions shown, 19 images · non-contrast
Comparison: None.

CLINICAL DATA: Mental status change with unknown cause. Alzheimer's
dementia.

EXAM:
CT HEAD WITHOUT CONTRAST
TECHNIQUE: Contiguous axial images were obtained from the base of the skull
through the vertex without intravenous contrast.

[Series 3: head wo · axial · 0.40mm/px · z∈[-164,-39]mm · 10 of 31 slices shown, 13 images]
[im 3/31  brain]
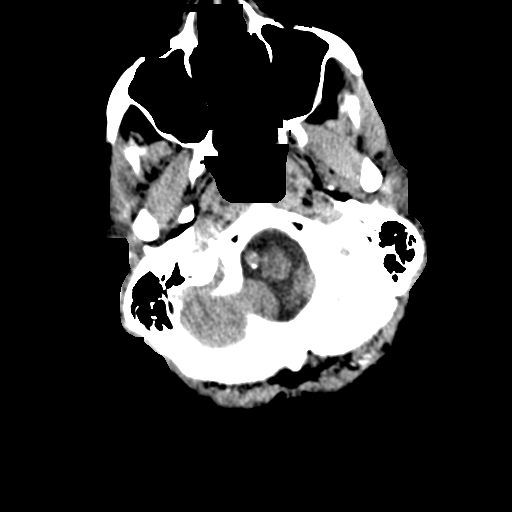
[im 3/31  bone]
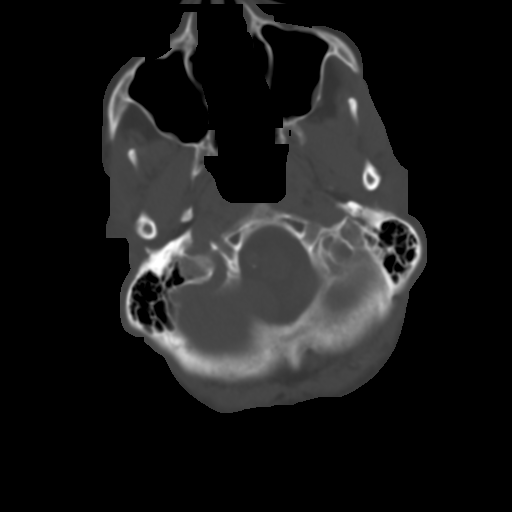
[im 6/31  brain]
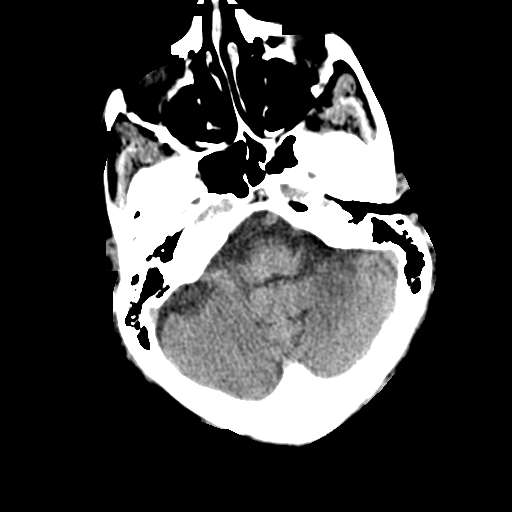
[im 9/31  brain]
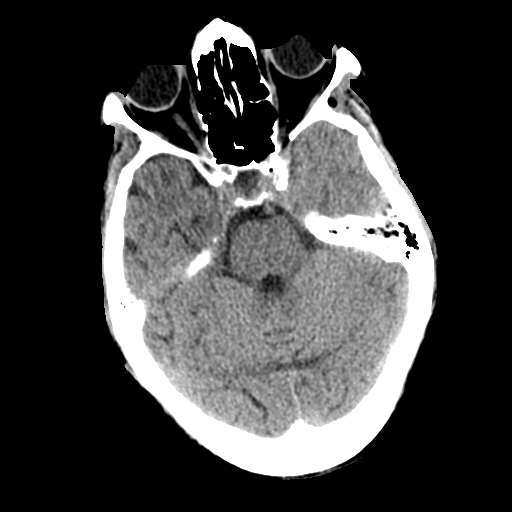
[im 11/31  brain]
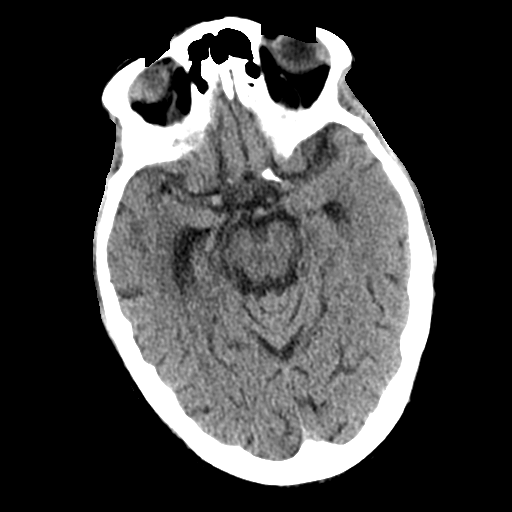
[im 14/31  brain]
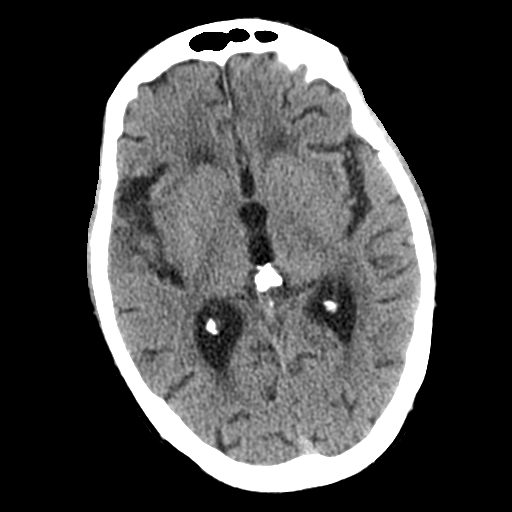
[im 14/31  bone]
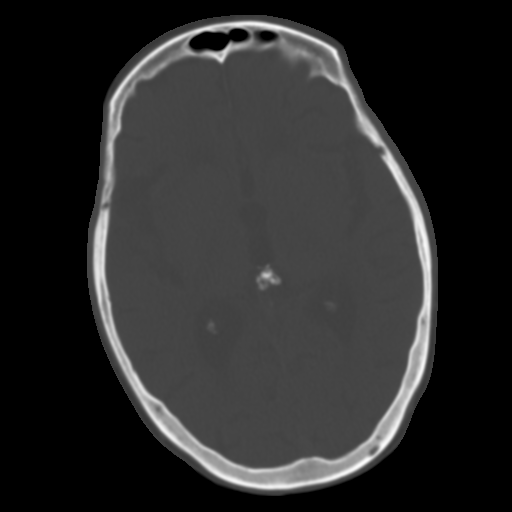
[im 17/31  brain]
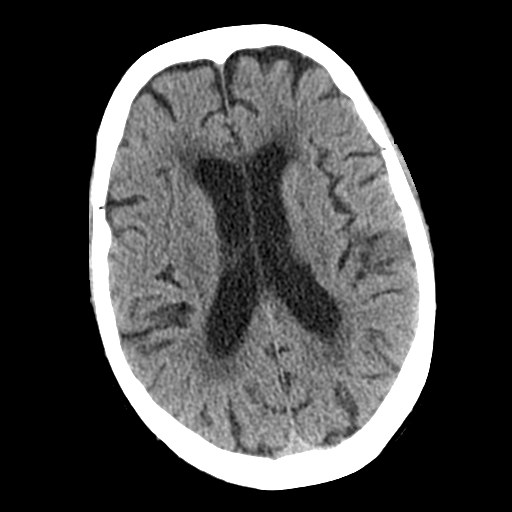
[im 20/31  brain]
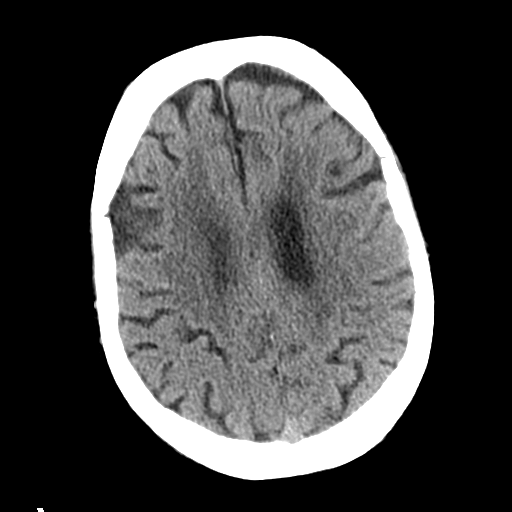
[im 23/31  brain]
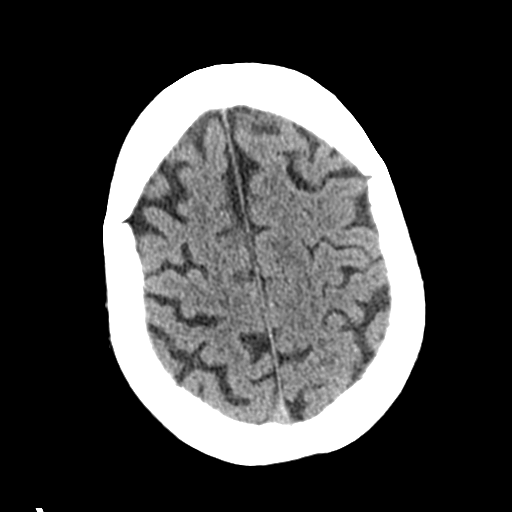
[im 25/31  brain]
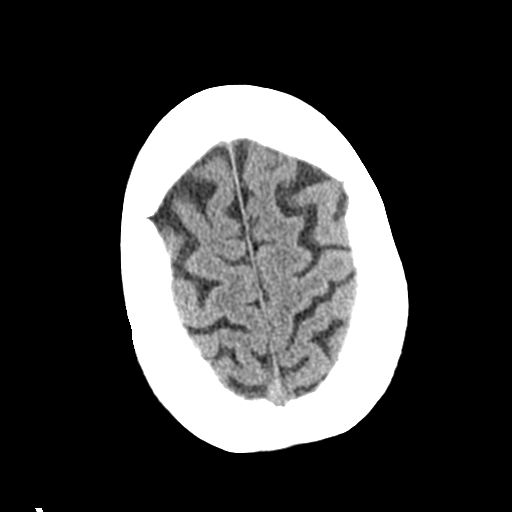
[im 25/31  bone]
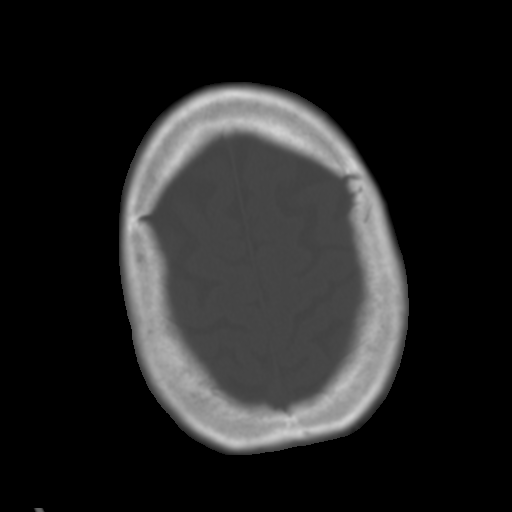
[im 28/31  brain]
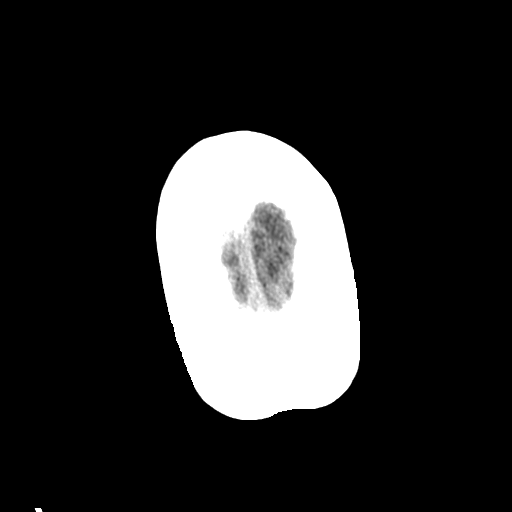

[Series 4: coronal soft tissue · coronal · 0.29mm/px · 3 of 67 slices shown]
[im 24/67  brain]
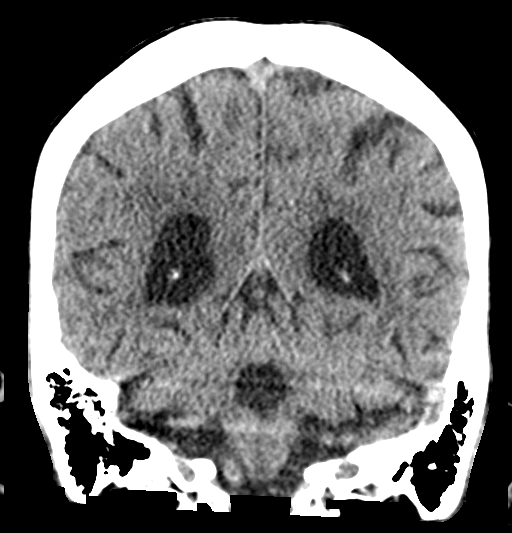
[im 30/67  brain]
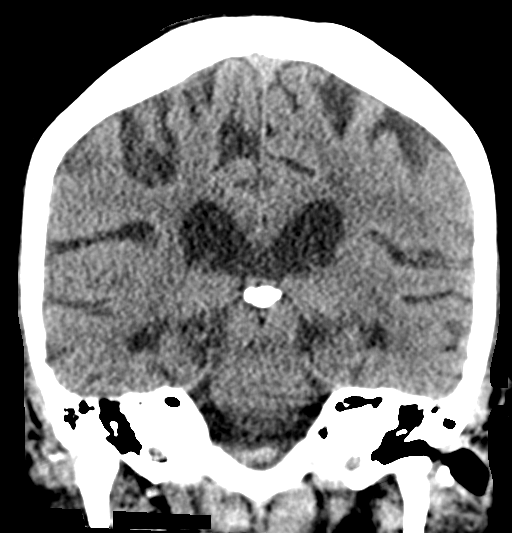
[im 37/67  brain]
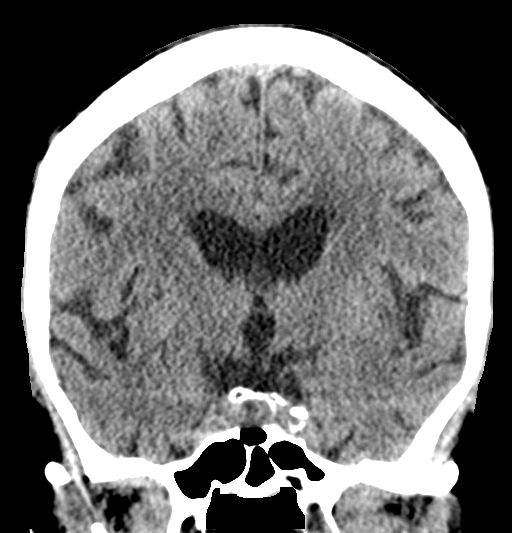

[Series 5: sagittal soft tissue · sagittal · 0.30mm/px · 3 of 50 slices shown]
[im 17/50  brain]
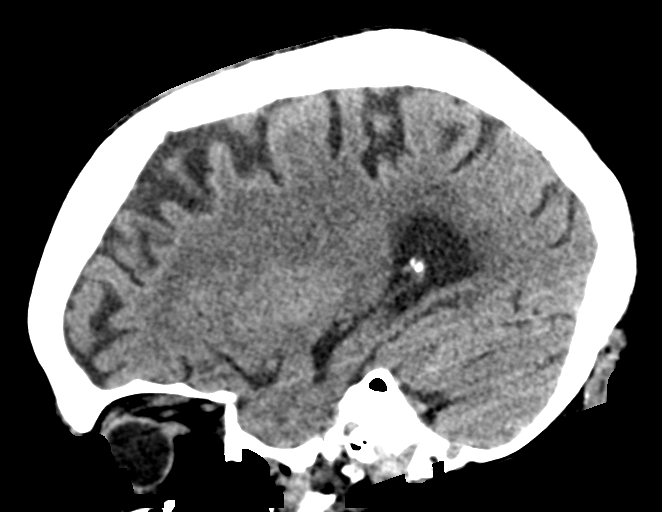
[im 25/50  brain]
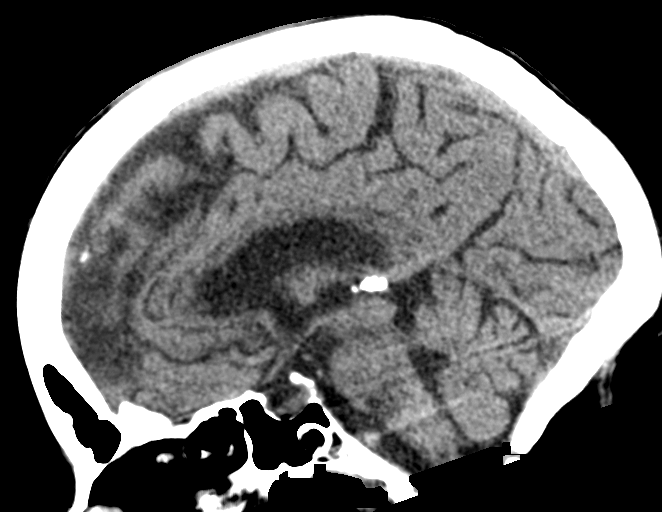
[im 33/50  brain]
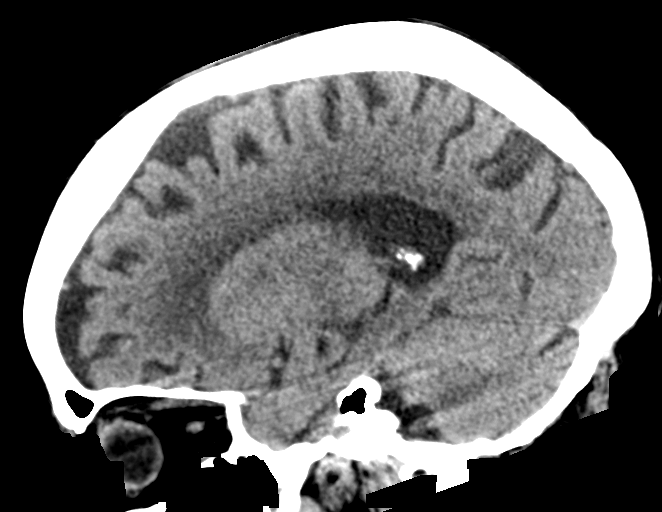

[16 of 47 positions shown; findings below may reference images not displayed]

FINDINGS: Brain: No evidence of acute infarction, hemorrhage, hydrocephalus,
extra-axial collection or mass lesion/mass effect. Mild for age and
history cerebral volume loss. Chronic small vessel ischemia in the
deep cerebral white matter.

Vascular: No hyperdense vessel or unexpected calcification.

Skull: Normal. Negative for fracture or focal lesion.

Sinuses/Orbits: Negative for acute finding. Bilateral cataract
resection.

Other: Gas at the bilateral infratemporal fossa attributed to
intravenous air.
IMPRESSION: Aging brain without acute superimposed finding.
